# Patient Record
Sex: Male | Born: 1965 | Race: Black or African American | Hispanic: No | Marital: Married | Smoking: Never smoker
Health system: Southern US, Community
[De-identification: ages and names within clinical notes are randomized; demographics above are authoritative.]

## PROBLEM LIST (undated history)

## (undated) DIAGNOSIS — I10 Essential (primary) hypertension: Secondary | ICD-10-CM

## (undated) DIAGNOSIS — I639 Cerebral infarction, unspecified: Secondary | ICD-10-CM

## (undated) HISTORY — PX: HERNIA REPAIR: SHX51

---

## 2019-03-08 ENCOUNTER — Ambulatory Visit: Payer: Self-pay

## 2019-03-08 ENCOUNTER — Other Ambulatory Visit: Payer: Self-pay

## 2019-03-08 ENCOUNTER — Emergency Department
Admission: EM | Admit: 2019-03-08 | Discharge: 2019-03-08 | Disposition: A | Discharge status: Home / Self Care | Payer: Self-pay | Attending: Emergency Medicine | Admitting: Emergency Medicine

## 2019-03-08 ENCOUNTER — Emergency Department: Payer: Self-pay

## 2019-03-08 ENCOUNTER — Encounter: Payer: Self-pay | Admitting: Emergency Medicine

## 2019-03-08 DIAGNOSIS — I10 Essential (primary) hypertension: Secondary | ICD-10-CM

## 2019-03-08 DIAGNOSIS — Z8673 Personal history of transient ischemic attack (TIA), and cerebral infarction without residual deficits: Secondary | ICD-10-CM | POA: Insufficient documentation

## 2019-03-08 DIAGNOSIS — R51 Headache: Secondary | ICD-10-CM | POA: Insufficient documentation

## 2019-03-08 HISTORY — DX: Essential (primary) hypertension: I10

## 2019-03-08 HISTORY — DX: Cerebral infarction, unspecified: I63.9

## 2019-03-08 LAB — CBC
HCT: 47.2 % (ref 39.0–52.0)
Hemoglobin: 16.2 g/dL (ref 13.0–17.0)
MCH: 30.5 pg (ref 26.0–34.0)
MCHC: 34.3 g/dL (ref 30.0–36.0)
MCV: 88.7 fL (ref 80.0–100.0)
Platelets: 207 10*3/uL (ref 150–400)
RBC: 5.32 MIL/uL (ref 4.22–5.81)
RDW: 12.7 % (ref 11.5–15.5)
WBC: 9.9 10*3/uL (ref 4.0–10.5)
nRBC: 0 % (ref 0.0–0.2)

## 2019-03-08 LAB — URINALYSIS, COMPLETE (UACMP) WITH MICROSCOPIC
Bacteria, UA: NONE SEEN
Bilirubin Urine: NEGATIVE
Glucose, UA: NEGATIVE mg/dL
Ketones, ur: NEGATIVE mg/dL
Leukocytes,Ua: NEGATIVE
Nitrite: NEGATIVE
Protein, ur: 30 mg/dL — AB
Specific Gravity, Urine: 1.023 (ref 1.005–1.030)
pH: 5 (ref 5.0–8.0)

## 2019-03-08 LAB — BASIC METABOLIC PANEL
Anion gap: 10 (ref 5–15)
BUN: 18 mg/dL (ref 6–20)
CO2: 24 mmol/L (ref 22–32)
Calcium: 8.8 mg/dL — ABNORMAL LOW (ref 8.9–10.3)
Chloride: 106 mmol/L (ref 98–111)
Creatinine, Ser: 1.35 mg/dL — ABNORMAL HIGH (ref 0.61–1.24)
GFR calc Af Amer: >60 mL/min (ref >60)
GFR calc non Af Amer: 59 mL/min — ABNORMAL LOW (ref >60)
Glucose, Bld: 140 mg/dL — ABNORMAL HIGH (ref 70–99)
Potassium: 3.7 mmol/L (ref 3.5–5.1)
Sodium: 140 mmol/L (ref 135–145)

## 2019-03-08 MED ORDER — CLONIDINE HCL 0.1 MG PO TABS
0.1000 mg | ORAL_TABLET | Freq: Once | ORAL | Status: AC
  Administered 2019-03-08: 0.1 mg via ORAL
  Filled 2019-03-08: qty 1

## 2019-03-08 MED ORDER — AMLODIPINE BESYLATE 10 MG PO TABS: 10.0000 mg | ORAL_TABLET | Freq: Every day | ORAL | 11 refills | Status: DC

## 2019-03-08 MED ORDER — SODIUM CHLORIDE 0.9% FLUSH: 3.0000 mL | Freq: Once | INTRAVENOUS | Status: DC

## 2019-03-08 MED ORDER — HYDROCHLOROTHIAZIDE 25 MG PO TABS: 25.0000 mg | ORAL_TABLET | Freq: Every day | ORAL | 0 refills | Status: DC

## 2019-03-08 NOTE — Telephone Encounter (Signed)
Incoming call from Patient with complaint of Sx. Of elevated B/P.Marland Kitchen  Unable to take B/P due to machine s being packed up.  Just moved to this location.  Recommended Patient be evaluated at Urgent Care or ED.    Reason for Disposition . [1] Systolic BP  >= 180 OR Diastolic >= 110 AND [2] missed most recent dose of blood pressure medication  Answer Assessment - Initial Assessment Questions 1. BLOOD PRESSURE: "What is the blood pressure?" "Did you take at least two measurements 5 minutes apart?"     2. ONSET: "When did you take your blood pressure?"    Several  Week ago  3. HOW: "How did you obtain the blood pressure?" (e.g., visiting nurse, automatic home BP monitor)     Packed away in  Boxes, yes 4. HISTORY: "Do you have a history of high blood pressure?"    yes 5. MEDICATIONS: "Are you taking any medications for blood pressure?" "Have you missed any doses recently?"     *No Answer* 6. OTHER SYMPTOMS: "Do you have any symptoms?" (e.g., headache, chest pain, blurred vision, difficulty breathing, weakness)     Headaches ,  dizzy 7. PREGNANCY: "Is there any chance you are pregnant?" "When was your last menstrual period?"     na  Protocols used: HIGH BLOOD PRESSURE-A-AH

## 2019-03-08 NOTE — ED Provider Notes (Signed)
[facility] Emergency Department Provider Note   ____________________________________________    I have reviewed the triage vital signs and the nursing notes.   HISTORY  Chief Complaint Headache and Dizziness     HPI Ricky Sullivan is a 53 y.o. male who presents with complaints of intermittent headaches over the last several months.  He also notes that his blood pressure has been out of control for several months as well.  He has not been taking his blood pressure medications.  He denies neuro deficits.  He describes headache typically in the morning which improves throughout the day.  Occasionally he takes Advil.  He states this occurs 2-3 times out of the week.  No nausea or vomiting.  Currently states he feels well has no headache and no physical complaints  Past Medical History:  Diagnosis Date  . Hypertension   . Stroke [facility])     There are no active problems to display for this patient.   Past Surgical History:  Procedure Laterality Date  . HERNIA REPAIR      Prior to Admission medications   Medication Sig Start Date End Date Taking? Authorizing Provider  amLODipine (NORVASC) 10 MG tablet Take 1 tablet (10 mg total) by mouth daily. 03/08/19 03/07/20  Jene Every, MD  hydrochlorothiazide (HYDRODIURIL) 25 MG tablet Take 1 tablet (25 mg total) by mouth daily. 03/08/19   Jene Every, MD     Allergies Patient has no allergy information on record.  No family history on file.  Social History Social History   Tobacco Use  . Smoking status: Never Smoker  . Smokeless tobacco: Never Used  Substance Use Topics  . Alcohol use: Yes  . Drug use: Not on file    Review of Systems  Constitutional: No fever/chills Eyes: No visual changes.  ENT: No sore throat. Cardiovascular: Denies chest pain. Respiratory: Denies shortness of breath. Gastrointestinal: No abdominal pain.  No nausea, no vomiting.   Genitourinary: Negative for dysuria.  Musculoskeletal: Negative for back pain. Skin: Negative for rash. Neurological: As above   ____________________________________________   PHYSICAL EXAM:  VITAL SIGNS: ED Triage Vitals  Enc Vitals Group     BP 03/08/19 1256 (!) 233/128     Pulse Rate 03/08/19 1256 76     Resp 03/08/19 1256 18     Temp 03/08/19 1256 98.7 F (37.1 C)     Temp Source 03/08/19 1256 Oral     SpO2 03/08/19 1256 99 %     Weight 03/08/19 1242 86.2 kg (190 lb)     Height 03/08/19 1242 1.778 m (5\' 10" )     Head Circumference --      Peak Flow --      Pain Score 03/08/19 1241 7     Pain Loc --      Pain Edu? --      Excl. in GC? --     Constitutional: Alert and oriented.  Eyes: Conjunctivae are normal.  Head: Atraumatic. Nose: No congestion/rhinnorhea. Mouth/Throat: Mucous membranes are moist.   Neck:  Painless ROM Cardiovascular: Normal rate, regular rhythm. Grossly normal heart sounds.  Good peripheral circulation. Respiratory: Normal respiratory effort.  No retractions. Lungs CTAB. Gastrointestinal: Soft and nontender. No distention.  No CVA tenderness. Genitourinary: deferred Musculoskeletal: No lower extremity tenderness nor edema.  Warm and well perfused Neurologic:  Normal speech and language. No gross focal neurologic deficits are appreciated.  Skin:  Skin is warm, dry and intact. No rash noted. Psychiatric:  Mood and affect are normal. Speech and behavior are normal.  ____________________________________________   LABS (all labs ordered are listed, but only abnormal results are displayed)  Labs Reviewed  BASIC METABOLIC PANEL - Abnormal; Notable for the following components:      Result Value   Glucose, Bld 140 (*)    Creatinine, Ser 1.35 (*)    Calcium 8.8 (*)    GFR calc non Af Amer 59 (*)    All other components within normal limits  URINALYSIS, COMPLETE (UACMP) WITH MICROSCOPIC - Abnormal; Notable for the following components:   Color, Urine YELLOW (*)    APPearance CLEAR  (*)    Hgb urine dipstick SMALL (*)    Protein, ur 30 (*)    All other components within normal limits  CBC  CBG MONITORING, ED   ____________________________________________  EKG ED ECG REPORT I, Jene Everyobert [place], the attending physician, personally viewed and interpreted this ECG.  Date: 03/08/2019  Rhythm: normal sinus rhythm QRS Axis: normal Intervals: normal ST/T Wave abnormalities: Nonspecific changes Narrative Interpretation: no evidence of acute ischemia   ____________________________________________  RADIOLOGY  CT head unremarkable ____________________________________________   PROCEDURES  Procedure(s) performed: No  Procedures   Critical Care performed: No ____________________________________________   INITIAL IMPRESSION / ASSESSMENT AND PLAN / ED COURSE  Pertinent labs & imaging results that were available during my care of the patient were reviewed by me and considered in my medical decision making (see chart for details).  Patient presents with complaints as noted above, currently he is asymptomatic and overall feels well.  He does have a significantly elevated blood pressure, this is essentially chronic for him.  He has reassuring work-up here.  Given that he is not taking any blood pressure medications we will need to start him back on his hydrochlorothiazide and amlodipine and have him follow-up closely with PCP in order to control blood pressure.  No occasion for admission at this time as the patient is asymptomatic with unremarkable work-up.    ____________________________________________   FINAL CLINICAL IMPRESSION(S) / ED DIAGNOSES  Final diagnoses:  Essential hypertension        Note:  This document was prepared using Dragon voice recognition software and may include unintentional dictation errors.   Jene EveryKinner, Luvern Mischke, MD 03/08/19 [phone]

## 2019-03-08 NOTE — ED Triage Notes (Signed)
Says headache and thinks his bp is up.  Is not on meds for 2-3 months.  Could nt afford it.  Was on hctz

## 2019-03-08 NOTE — ED Notes (Signed)
Patient transported to CT 

## 2019-03-08 NOTE — ED Triage Notes (Signed)
Pt c/o dizziness intermittently for the past month. Denies any pain.

## 2021-04-24 ENCOUNTER — Emergency Department: Payer: Self-pay

## 2021-04-24 ENCOUNTER — Other Ambulatory Visit: Payer: Self-pay

## 2021-04-24 ENCOUNTER — Emergency Department
Admission: EM | Admit: 2021-04-24 | Discharge: 2021-04-24 | Disposition: A | Discharge status: Home / Self Care | Payer: Self-pay | Attending: Emergency Medicine | Admitting: Emergency Medicine

## 2021-04-24 DIAGNOSIS — R0602 Shortness of breath: Secondary | ICD-10-CM | POA: Insufficient documentation

## 2021-04-24 DIAGNOSIS — R42 Dizziness and giddiness: Secondary | ICD-10-CM

## 2021-04-24 DIAGNOSIS — R0600 Dyspnea, unspecified: Secondary | ICD-10-CM

## 2021-04-24 DIAGNOSIS — Z20822 Contact with and (suspected) exposure to covid-19: Secondary | ICD-10-CM | POA: Insufficient documentation

## 2021-04-24 DIAGNOSIS — I10 Essential (primary) hypertension: Secondary | ICD-10-CM

## 2021-04-24 DIAGNOSIS — Z79899 Other long term (current) drug therapy: Secondary | ICD-10-CM | POA: Insufficient documentation

## 2021-04-24 LAB — COMPREHENSIVE METABOLIC PANEL
ALT: 14 U/L (ref 0–44)
AST: 11 U/L — ABNORMAL LOW (ref 15–41)
Albumin: 3.9 g/dL (ref 3.5–5.0)
Alkaline Phosphatase: 79 U/L (ref 38–126)
Anion gap: 9 (ref 5–15)
BUN: 20 mg/dL (ref 6–20)
CO2: 25 mmol/L (ref 22–32)
Calcium: 9 mg/dL (ref 8.9–10.3)
Chloride: 105 mmol/L (ref 98–111)
Creatinine, Ser: 1.4 mg/dL — ABNORMAL HIGH (ref 0.61–1.24)
GFR, Estimated: 59 mL/min — ABNORMAL LOW (ref >60)
Glucose, Bld: 111 mg/dL — ABNORMAL HIGH (ref 70–99)
Potassium: 3.7 mmol/L (ref 3.5–5.1)
Sodium: 139 mmol/L (ref 135–145)
Total Bilirubin: 0.6 mg/dL (ref 0.3–1.2)
Total Protein: 8.1 g/dL (ref 6.5–8.1)

## 2021-04-24 LAB — CBC
HCT: 44.7 % (ref 39.0–52.0)
Hemoglobin: 14.9 g/dL (ref 13.0–17.0)
MCH: 30.7 pg (ref 26.0–34.0)
MCHC: 33.3 g/dL (ref 30.0–36.0)
MCV: 92 fL (ref 80.0–100.0)
Platelets: 309 10*3/uL (ref 150–400)
RBC: 4.86 MIL/uL (ref 4.22–5.81)
RDW: 12.6 % (ref 11.5–15.5)
WBC: 9.5 10*3/uL (ref 4.0–10.5)
nRBC: 0 % (ref 0.0–0.2)

## 2021-04-24 LAB — TROPONIN I (HIGH SENSITIVITY)
Troponin I (High Sensitivity): 10 ng/L (ref <18)
Troponin I (High Sensitivity): 9 ng/L (ref <18)

## 2021-04-24 LAB — RESP PANEL BY RT-PCR (FLU A&B, COVID) ARPGX2
Influenza A by PCR: NEGATIVE
Influenza B by PCR: NEGATIVE
SARS Coronavirus 2 by RT PCR: NEGATIVE

## 2021-04-24 LAB — BRAIN NATRIURETIC PEPTIDE: B Natriuretic Peptide: 149 pg/mL — ABNORMAL HIGH (ref 0.0–100.0)

## 2021-04-24 MED ORDER — HYDROCHLOROTHIAZIDE 25 MG PO TABS: 25.0000 mg | ORAL_TABLET | Freq: Every day | ORAL | 0 refills | Status: AC

## 2021-04-24 MED ORDER — CLONIDINE HCL 0.1 MG PO TABS
0.1000 mg | ORAL_TABLET | Freq: Once | ORAL | Status: AC
  Administered 2021-04-24: 0.1 mg via ORAL
  Filled 2021-04-24: qty 1

## 2021-04-24 MED ORDER — AMLODIPINE BESYLATE 10 MG PO TABS: 10.0000 mg | ORAL_TABLET | Freq: Every day | ORAL | 0 refills | Status: AC

## 2021-04-24 MED ORDER — AMLODIPINE BESYLATE 5 MG PO TABS
10.0000 mg | ORAL_TABLET | Freq: Once | ORAL | Status: AC
  Administered 2021-04-24: 10 mg via ORAL
  Filled 2021-04-24: qty 2

## 2021-04-24 MED ORDER — HYDROCHLOROTHIAZIDE 25 MG PO TABS
25.0000 mg | ORAL_TABLET | Freq: Once | ORAL | Status: AC
  Administered 2021-04-24: 25 mg via ORAL
  Filled 2021-04-24: qty 1

## 2021-04-24 NOTE — ED Notes (Signed)
Patient transported to CT 

## 2021-04-24 NOTE — ED Notes (Signed)
Patient Alert and oriented to baseline. Stable and ambulatory to baseline. Patient verbalized understanding of the discharge instructions.  Patient belongings were taken by the patient.  a 

## 2021-04-24 NOTE — ED Triage Notes (Signed)
Pt states he woke up with SOB and dizziness this morning, denies any pain denies any recent illness

## 2021-04-24 NOTE — Discharge Instructions (Addendum)
As we discussed please fill your prescriptions and begin taking these medications tomorrow morning as prescribed.  Please follow-up with your doctor in the next several days for recheck/reevaluation.  Return to the emergency department for any return of shortness of breath, any chest pain, or any other symptom personally concerning to yourself.

## 2021-04-24 NOTE — ED Provider Notes (Signed)
[facility] Emergency Department Provider Note  Time seen: 8:52 AM  I have reviewed the triage vital signs and the nursing notes.   HISTORY  Chief Complaint Shortness of Breath and Dizziness   HPI Ricky Sullivan is a 55 y.o. male with a past medical history of hypertension, CVA, presents to the emergency department for dizziness and shortness of breath.  According to the patient he states over the past 2 weeks he has had an upper respiratory infection/cold, states over the past couple days he has been feeling intermittently dizzy this morning he awoke with some mild shortness of breath.  Patient denies any chest pain.  Denies any fever.  Does state a mild cough but states this is largely chronic.   Past Medical History:  Diagnosis Date   Hypertension    Stroke [facility])     There are no problems to display for this patient.   Past Surgical History:  Procedure Laterality Date   HERNIA REPAIR      Prior to Admission medications   Medication Sig Start Date End Date Taking? Authorizing Provider  amLODipine (NORVASC) 10 MG tablet Take 1 tablet (10 mg total) by mouth daily. 03/08/19 03/07/20  Jene Every, MD  hydrochlorothiazide (HYDRODIURIL) 25 MG tablet Take 1 tablet (25 mg total) by mouth daily. 03/08/19   Jene Every, MD    Allergies  Allergen Reactions   Lisinopril Cough    No family history on file.  Social History Social History   Tobacco Use   Smoking status: Never   Smokeless tobacco: Never  Substance Use Topics   Alcohol use: Yes    Review of Systems Constitutional: Negative for fever.  Positive for dizziness intermittent over the past several days Cardiovascular: Negative for chest pain. Respiratory: Shortness of breath since this morning.  Positive for cough, chronic per patient. Gastrointestinal: Negative for abdominal pain, vomiting  Musculoskeletal: Negative for musculoskeletal complaints Neurological: Negative for headache All  other ROS negative  ____________________________________________   PHYSICAL EXAM:  VITAL SIGNS: ED Triage Vitals  Enc Vitals Group     BP 04/24/21 0841 (!) 182/113     Pulse Rate 04/24/21 0841 73     Resp 04/24/21 0841 20     Temp 04/24/21 0841 (!) 97.4 F (36.3 C)     Temp Source 04/24/21 0841 Oral     SpO2 04/24/21 0841 97 %     Weight 04/24/21 0834 180 lb (81.6 kg)     Height 04/24/21 0834 5\' 11"  (1.803 m)     Head Circumference --      Peak Flow --      Pain Score 04/24/21 0834 0     Pain Loc --      Pain Edu? --      Excl. in GC? --    Constitutional: Alert and oriented. Well appearing and in no distress. Eyes: Normal exam ENT      Head: Normocephalic and atraumatic.      Mouth/Throat: Mucous membranes are moist. Cardiovascular: Normal rate, regular rhythm.  Respiratory: Normal respiratory effort without tachypnea nor retractions. Breath sounds are clear  Gastrointestinal: Soft and nontender. No distention.   Musculoskeletal: Nontender with normal range of motion in all extremities.  Neurologic:  Normal speech and language. No gross focal neurologic deficits  Skin:  Skin is warm, dry and intact.  Psychiatric: Mood and affect are normal.  ____________________________________________    EKG  EKG viewed and interpreted by myself shows normal sinus rhythm  at 76 bpm with a narrow QRS, normal axis, normal intervals, no concerning ST changes.  ____________________________________________    RADIOLOGY  Chest x-ray shows questionable lung nodule. CT scan shows several enlarged lung nodules largest of which measures 9 mm.  ____________________________________________   INITIAL IMPRESSION / ASSESSMENT AND PLAN / ED COURSE  Pertinent labs & imaging results that were available during my care of the patient were reviewed by me and considered in my medical decision making (see chart for details).   Patient presents emergency department for intermittent dizziness  over the past several days along with shortness of breath this morning.  Patient is noted to be quite hypertensive 182/113.  States he ran out of his blood pressure medications a little over a week ago.  Patient is in no distress, clear lung sounds bilaterally.  Satting upper 90s on room air during my evaluation.  We will check labs, chest x-ray.  We will dose the patient's home blood pressure medications and continue to closely monitor.  Patient agreeable to plan of care.  Patient's work-up is reassuring.  Patient is feeling much better.  Blood pressure is down to 163/96.  I discussed with the patient I will write him 30-day prescriptions of his blood pressure medications which he will fill today and begin taking in the morning.  I also discussed CT findings and the need for repeat CT in 3 to 6 months.  Patient agreeable plan of care.  Lab work otherwise largely nonrevealing.  Ricky Sullivan was evaluated in Emergency Department on 04/24/2021 for the symptoms described in the history of present illness. He was evaluated in the context of the global COVID-19 pandemic, which necessitated consideration that the patient might be at risk for infection with the SARS-CoV-2 virus that causes COVID-19. Institutional protocols and algorithms that pertain to the evaluation of patients at risk for COVID-19 are in a state of rapid change based on information released by regulatory bodies including the CDC and federal and state organizations. These policies and algorithms were followed during the patient's care in the ED.  ____________________________________________   FINAL CLINICAL IMPRESSION(S) / ED DIAGNOSES  Dyspnea Hypertension Dizziness   Minna Antis, MD 04/24/21 1209

## 2022-10-17 IMAGING — CR DG CHEST 2V
2 series · 2 of 2 positions shown · non-contrast
Comparison: None.

CLINICAL DATA: 55-year-old male who woke with dizziness and
shortness of breath.

EXAM:
CHEST - 2 VIEW

[chest pa]
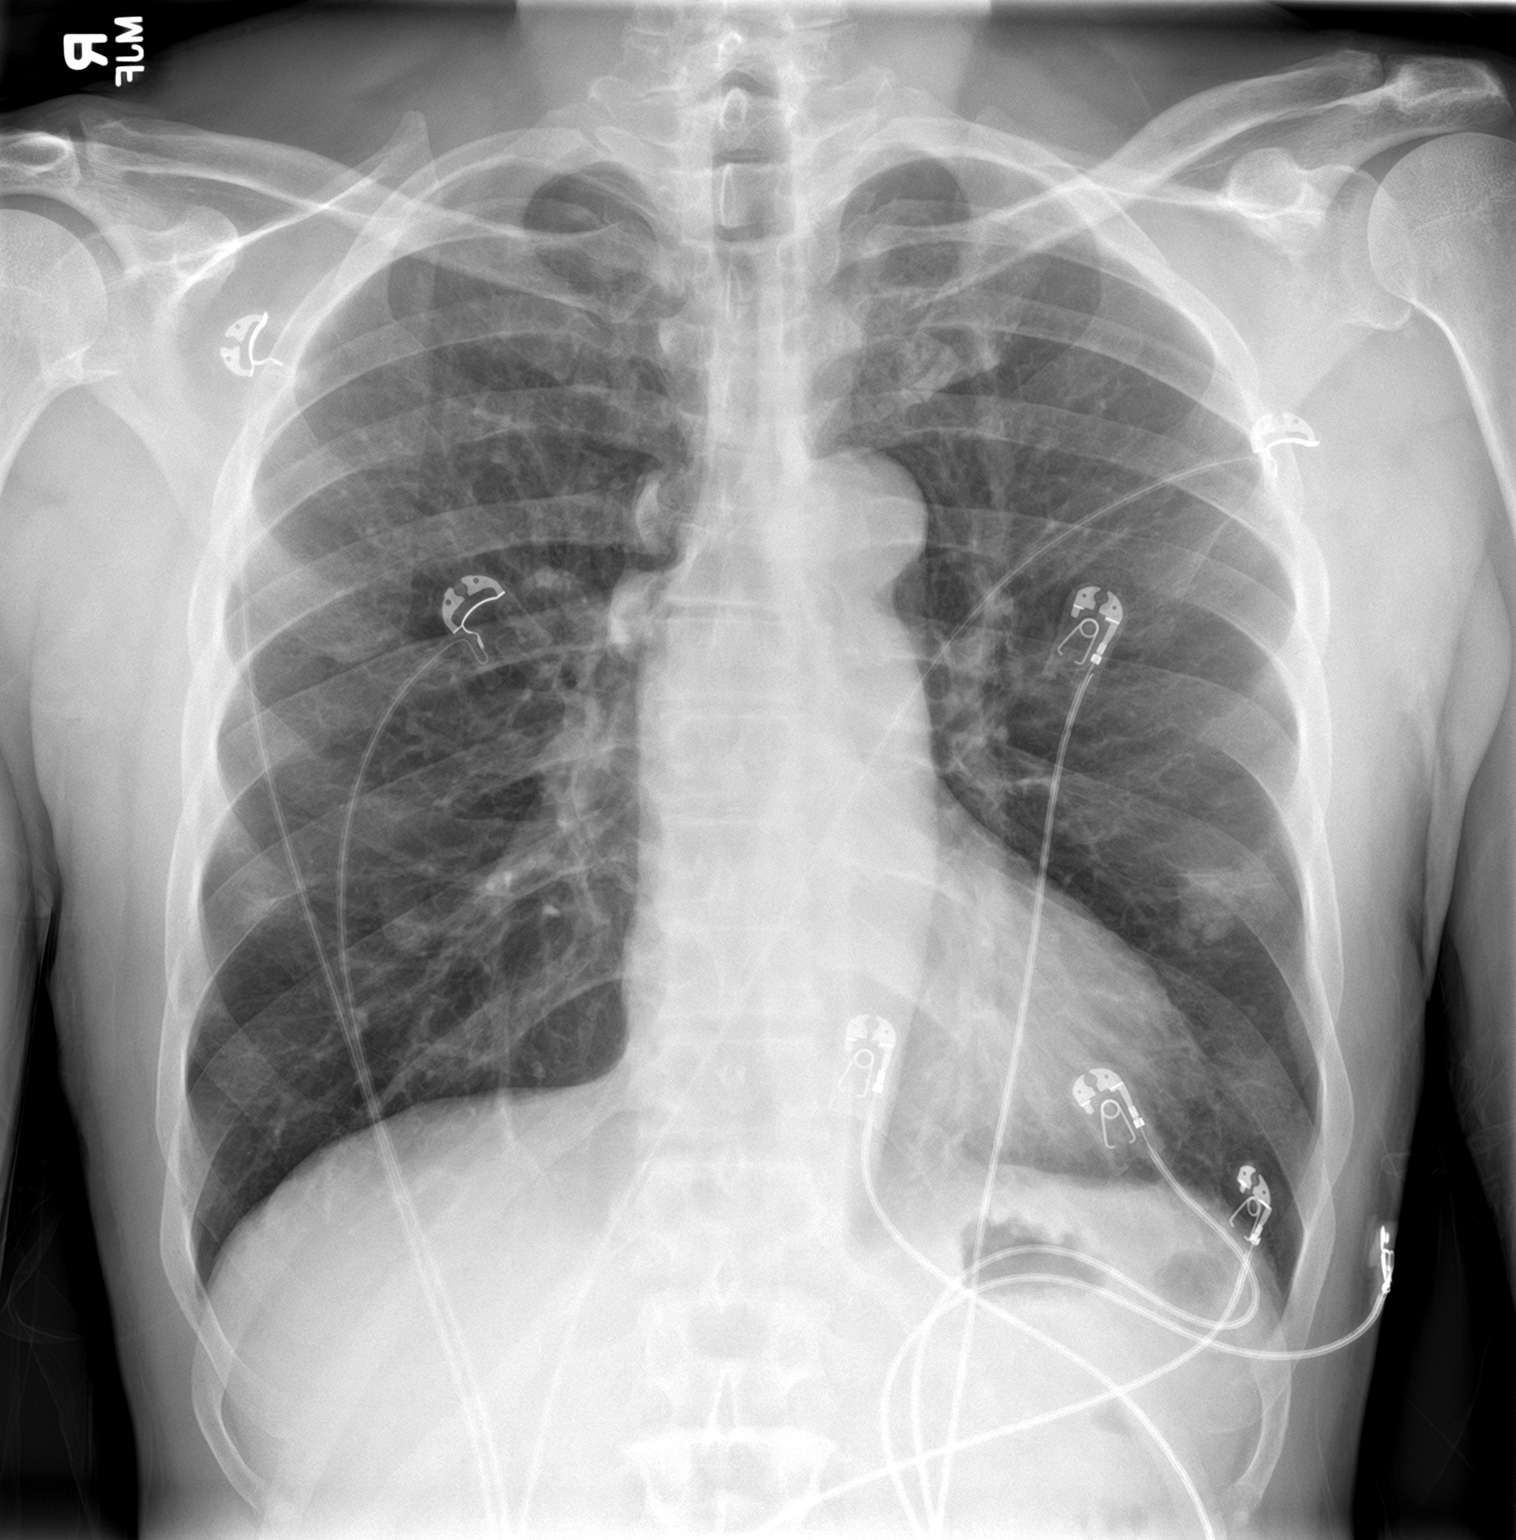

[chest lat]
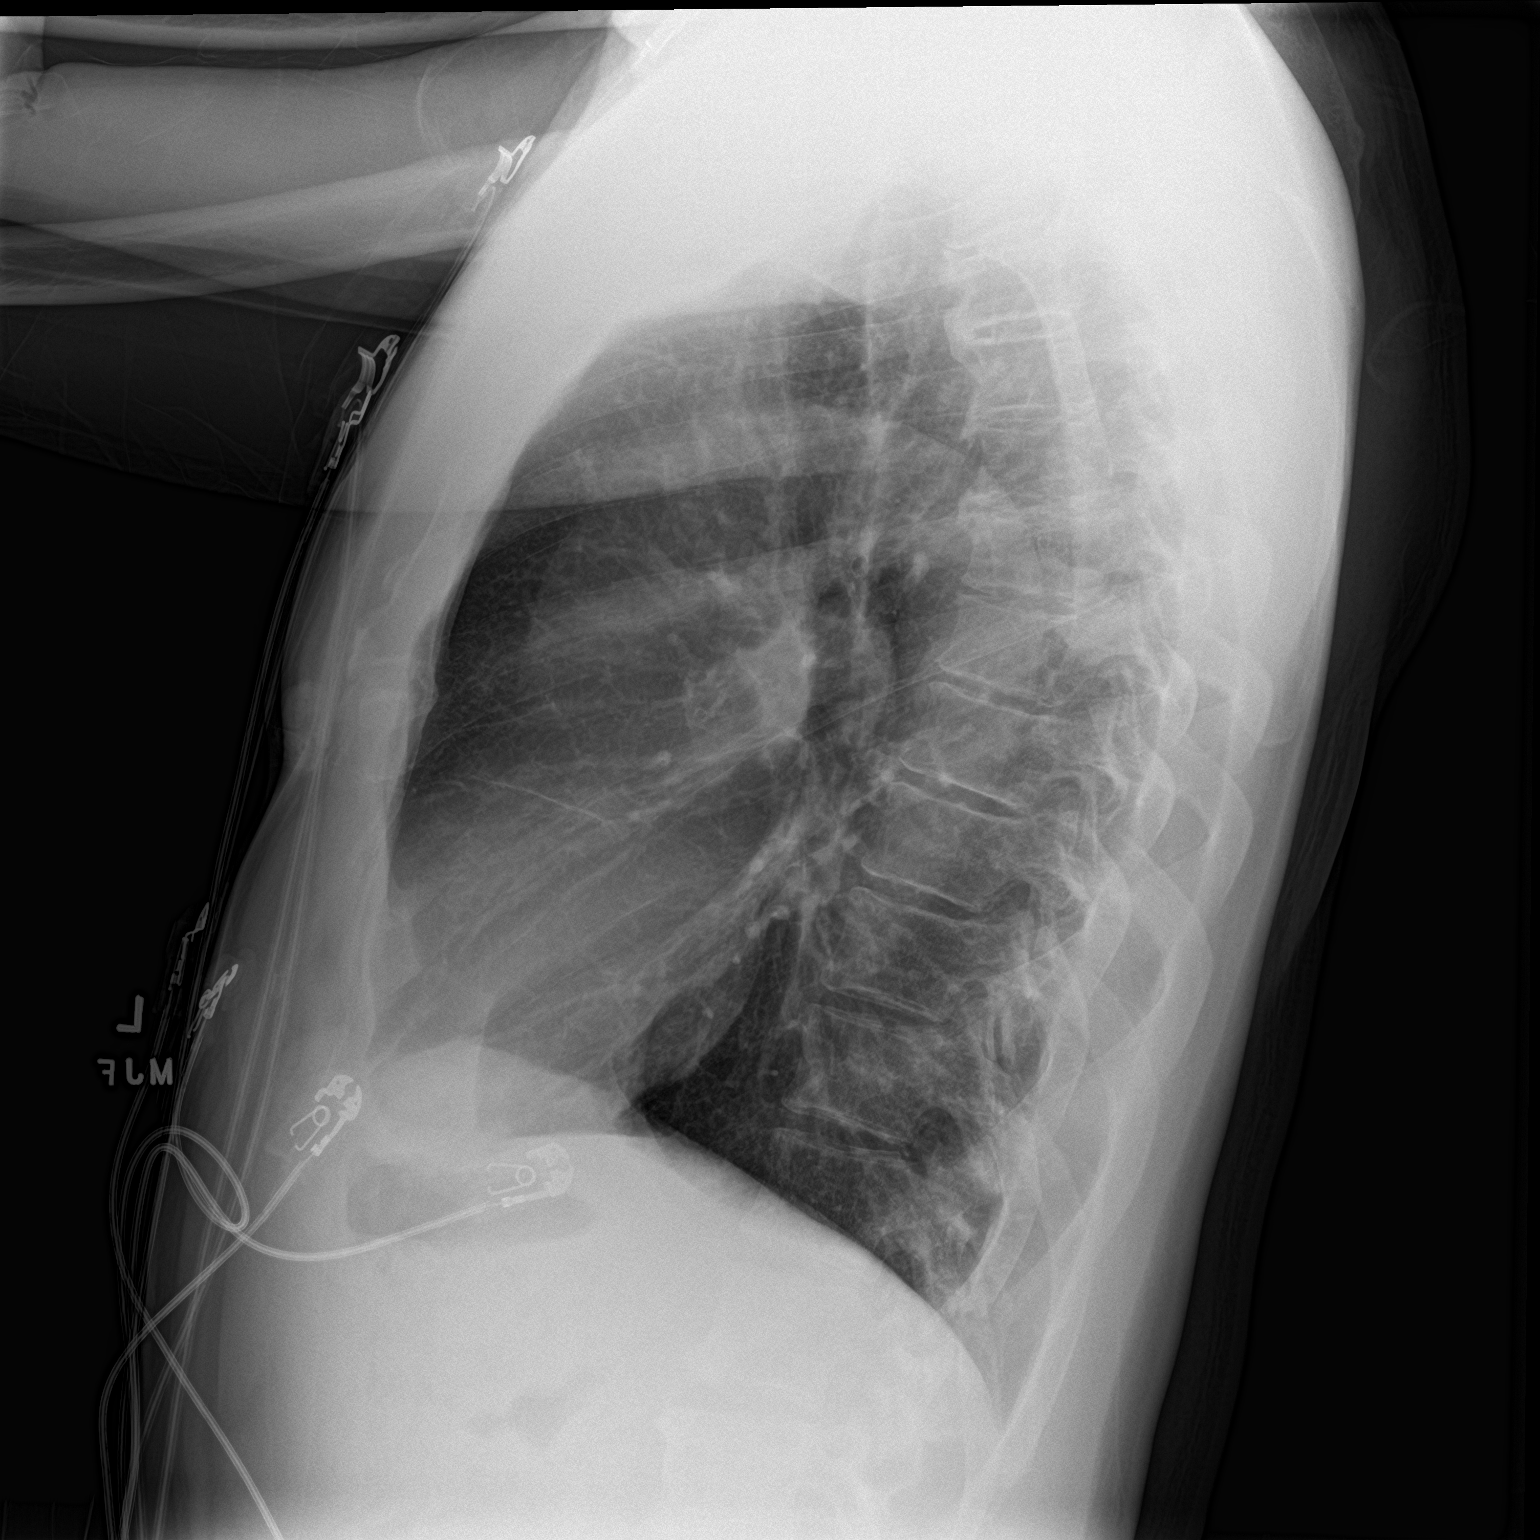

[2 of 2 positions shown; findings below may reference images not displayed]

FINDINGS: Normal lung volumes and mediastinal contours. Visualized tracheal
air column is within normal limits. No pneumothorax, pulmonary
edema, pleural effusion or consolidation. But there is an irregular
2-3 cm area of nodularity projecting over the left lower lung near
the heart border on the PA view. This is not identified within the
lung on the lateral view, and there is questionable evidence of
anterior chest wall correlation or artifact on the lateral. No other
convincing lung nodule. No acute osseous abnormality identified.
Paucity of bowel gas in the upper abdomen.
IMPRESSION: 1. Questionable 2-3 cm left lung nodule, versus external or chest
wall artifact. Noncontrast Chest CT would confirm.
2. No other cardiopulmonary abnormality.

## 2024-09-26 ENCOUNTER — Encounter (INDEPENDENT_AMBULATORY_CARE_PROVIDER_SITE_OTHER): Payer: Self-pay | Admitting: *Deleted

## 2024-10-30 ENCOUNTER — Other Ambulatory Visit (HOSPITAL_COMMUNITY): Payer: Self-pay | Admitting: Family Medicine

## 2024-10-30 DIAGNOSIS — R911 Solitary pulmonary nodule: Secondary | ICD-10-CM

## 2024-10-31 ENCOUNTER — Encounter (INDEPENDENT_AMBULATORY_CARE_PROVIDER_SITE_OTHER): Payer: Self-pay | Admitting: *Deleted

## 2024-11-22 ENCOUNTER — Ambulatory Visit (INDEPENDENT_AMBULATORY_CARE_PROVIDER_SITE_OTHER): Admitting: Gastroenterology

## 2025-01-05 ENCOUNTER — Ambulatory Visit: Admitting: Urology
